# Patient Record
Sex: Male | Born: 1985 | Race: Asian | Hispanic: No | Marital: Single
Health system: Southern US, Community
[De-identification: ages and names within clinical notes are randomized; demographics above are authoritative.]

---
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
## 2013-04-21 ENCOUNTER — Ambulatory Visit (HOSPITAL_COMMUNITY)
Admission: RE | Admit: 2013-04-21 | Discharge: 2013-04-21 | Disposition: A | Discharge status: Home / Self Care | Payer: Self-pay | Source: Ambulatory Visit | Attending: Internal Medicine | Admitting: Internal Medicine

## 2013-04-21 ENCOUNTER — Other Ambulatory Visit (HOSPITAL_COMMUNITY): Payer: Self-pay | Admitting: Internal Medicine

## 2013-04-21 DIAGNOSIS — N4400 Torsion of testis, unspecified: Secondary | ICD-10-CM

## 2013-04-21 DIAGNOSIS — N509 Disorder of male genital organs, unspecified: Secondary | ICD-10-CM | POA: Insufficient documentation

## 2016-11-24 ENCOUNTER — Ambulatory Visit (HOSPITAL_BASED_OUTPATIENT_CLINIC_OR_DEPARTMENT_OTHER)
Admission: RE | Admit: 2016-11-24 | Discharge: 2016-11-24 | Disposition: A | Discharge status: Home / Self Care | Payer: Self-pay | Source: Ambulatory Visit | Attending: Internal Medicine | Admitting: Internal Medicine

## 2016-11-24 ENCOUNTER — Other Ambulatory Visit (HOSPITAL_BASED_OUTPATIENT_CLINIC_OR_DEPARTMENT_OTHER): Payer: Self-pay | Admitting: Internal Medicine

## 2016-11-24 DIAGNOSIS — R1084 Generalized abdominal pain: Secondary | ICD-10-CM

## 2016-11-24 DIAGNOSIS — R1011 Right upper quadrant pain: Secondary | ICD-10-CM | POA: Insufficient documentation

## 2017-11-28 ENCOUNTER — Emergency Department (HOSPITAL_COMMUNITY): Payer: Self-pay

## 2017-11-28 ENCOUNTER — Observation Stay (HOSPITAL_COMMUNITY)
Admission: EM | Admit: 2017-11-28 | Discharge: 2017-11-29 | Disposition: A | Discharge status: Home / Self Care | Payer: Self-pay | Attending: General Surgery | Admitting: General Surgery

## 2017-11-28 ENCOUNTER — Encounter (HOSPITAL_COMMUNITY)
Admission: EM | Disposition: A | Discharge status: Home / Self Care | Payer: Self-pay | Source: Home / Self Care | Attending: Emergency Medicine

## 2017-11-28 ENCOUNTER — Encounter (HOSPITAL_COMMUNITY): Payer: Self-pay | Admitting: *Deleted

## 2017-11-28 ENCOUNTER — Emergency Department (HOSPITAL_COMMUNITY): Payer: Self-pay | Admitting: Certified Registered"

## 2017-11-28 DIAGNOSIS — Z9049 Acquired absence of other specified parts of digestive tract: Secondary | ICD-10-CM

## 2017-11-28 DIAGNOSIS — K3580 Unspecified acute appendicitis: Principal | ICD-10-CM | POA: Insufficient documentation

## 2017-11-28 HISTORY — PX: LAPAROSCOPIC APPENDECTOMY: SHX408

## 2017-11-28 LAB — URINALYSIS, ROUTINE W REFLEX MICROSCOPIC
Bilirubin Urine: NEGATIVE
GLUCOSE, UA: NEGATIVE mg/dL
HGB URINE DIPSTICK: NEGATIVE
KETONES UR: NEGATIVE mg/dL
LEUKOCYTES UA: NEGATIVE
Nitrite: NEGATIVE
PROTEIN: NEGATIVE mg/dL
Specific Gravity, Urine: 1.014 (ref 1.005–1.030)
pH: 7 (ref 5.0–8.0)

## 2017-11-28 LAB — COMPREHENSIVE METABOLIC PANEL
ALT: 19 U/L (ref 17–63)
AST: 19 U/L (ref 15–41)
Albumin: 4.1 g/dL (ref 3.5–5.0)
Alkaline Phosphatase: 60 U/L (ref 38–126)
Anion gap: 7 (ref 5–15)
BUN: 16 mg/dL (ref 6–20)
CHLORIDE: 106 mmol/L (ref 101–111)
CO2: 26 mmol/L (ref 22–32)
CREATININE: 0.99 mg/dL (ref 0.61–1.24)
Calcium: 9 mg/dL (ref 8.9–10.3)
GFR calc Af Amer: >60 mL/min (ref >60)
GFR calc non Af Amer: >60 mL/min (ref >60)
Glucose, Bld: 109 mg/dL — ABNORMAL HIGH (ref 65–99)
Potassium: 4.2 mmol/L (ref 3.5–5.1)
SODIUM: 139 mmol/L (ref 135–145)
Total Bilirubin: 0.6 mg/dL (ref 0.3–1.2)
Total Protein: 6.8 g/dL (ref 6.5–8.1)

## 2017-11-28 LAB — CBC
HCT: 47.1 % (ref 39.0–52.0)
Hemoglobin: 15.6 g/dL (ref 13.0–17.0)
MCH: 29.5 pg (ref 26.0–34.0)
MCHC: 33.1 g/dL (ref 30.0–36.0)
MCV: 89 fL (ref 78.0–100.0)
PLATELETS: 236 10*3/uL (ref 150–400)
RBC: 5.29 MIL/uL (ref 4.22–5.81)
RDW: 11.9 % (ref 11.5–15.5)
WBC: 10.4 10*3/uL (ref 4.0–10.5)

## 2017-11-28 LAB — LIPASE, BLOOD: LIPASE: 24 U/L (ref 11–51)

## 2017-11-28 SURGERY — APPENDECTOMY, LAPAROSCOPIC
Anesthesia: General | Site: Abdomen

## 2017-11-28 MED ORDER — PHENYLEPHRINE HCL 10 MG/ML IJ SOLN
INTRAMUSCULAR | Status: DC | PRN
  Administered 2017-11-28: 120 ug via INTRAVENOUS
  Administered 2017-11-28 (×2): 80 ug via INTRAVENOUS

## 2017-11-28 MED ORDER — FENTANYL CITRATE (PF) 100 MCG/2ML IJ SOLN
INTRAMUSCULAR | Status: DC | PRN
  Administered 2017-11-28: 150 ug via INTRAVENOUS
  Administered 2017-11-28 (×2): 50 ug via INTRAVENOUS

## 2017-11-28 MED ORDER — ONDANSETRON HCL 4 MG/2ML IJ SOLN
4.0000 mg | Freq: Four times a day (QID) | INTRAMUSCULAR | Status: DC | PRN
Start: 2017-11-28 — End: 2017-11-29

## 2017-11-28 MED ORDER — OXYCODONE HCL 5 MG/5ML PO SOLN: 5.0000 mg | Freq: Once | ORAL | Status: DC | PRN

## 2017-11-28 MED ORDER — ACETAMINOPHEN 160 MG/5ML PO SOLN: 325.0000 mg | ORAL | Status: DC | PRN

## 2017-11-28 MED ORDER — BUPIVACAINE-EPINEPHRINE (PF) 0.25% -1:200000 IJ SOLN
INTRAMUSCULAR | Status: AC
  Filled 2017-11-28: qty 30

## 2017-11-28 MED ORDER — ROCURONIUM BROMIDE 10 MG/ML (PF) SYRINGE
PREFILLED_SYRINGE | INTRAVENOUS | Status: AC
  Filled 2017-11-28: qty 5

## 2017-11-28 MED ORDER — MIDAZOLAM HCL 5 MG/5ML IJ SOLN
INTRAMUSCULAR | Status: DC | PRN
  Administered 2017-11-28: 2 mg via INTRAVENOUS

## 2017-11-28 MED ORDER — ACETAMINOPHEN 650 MG RE SUPP: 650.0000 mg | Freq: Four times a day (QID) | RECTAL | Status: DC | PRN

## 2017-11-28 MED ORDER — PHENOL 1.4 % MT LIQD
1.0000 | OROMUCOSAL | Status: DC | PRN
  Administered 2017-11-28: 1 via OROMUCOSAL
  Filled 2017-11-28: qty 177

## 2017-11-28 MED ORDER — DEXAMETHASONE SODIUM PHOSPHATE 10 MG/ML IJ SOLN
INTRAMUSCULAR | Status: DC | PRN
  Administered 2017-11-28: 10 mg via INTRAVENOUS

## 2017-11-28 MED ORDER — IOHEXOL 300 MG/ML  SOLN: 100.0000 mL | Freq: Once | INTRAMUSCULAR | Status: DC | PRN

## 2017-11-28 MED ORDER — OXYCODONE HCL 5 MG PO TABS
5.0000 mg | ORAL_TABLET | ORAL | Status: DC | PRN
  Administered 2017-11-29 (×2): 10 mg via ORAL
  Filled 2017-11-28 (×2): qty 2

## 2017-11-28 MED ORDER — METHOCARBAMOL 500 MG PO TABS
500.0000 mg | ORAL_TABLET | Freq: Four times a day (QID) | ORAL | Status: DC | PRN
  Administered 2017-11-29: 500 mg via ORAL
  Filled 2017-11-28: qty 1

## 2017-11-28 MED ORDER — SODIUM CHLORIDE 0.9 % IV SOLN
1.0000 g | Freq: Once | INTRAVENOUS | Status: AC
  Administered 2017-11-28: 1 g via INTRAVENOUS
  Filled 2017-11-28: qty 10

## 2017-11-28 MED ORDER — DIPHENHYDRAMINE HCL 25 MG PO CAPS: 25.0000 mg | ORAL_CAPSULE | Freq: Four times a day (QID) | ORAL | Status: DC | PRN

## 2017-11-28 MED ORDER — DEXAMETHASONE SODIUM PHOSPHATE 10 MG/ML IJ SOLN
INTRAMUSCULAR | Status: AC
  Filled 2017-11-28: qty 1

## 2017-11-28 MED ORDER — KCL IN DEXTROSE-NACL 20-5-0.45 MEQ/L-%-% IV SOLN
INTRAVENOUS | Status: DC
  Administered 2017-11-28: 23:00:00 via INTRAVENOUS
  Filled 2017-11-28: qty 1000

## 2017-11-28 MED ORDER — MIDAZOLAM HCL 2 MG/2ML IJ SOLN
INTRAMUSCULAR | Status: AC
  Filled 2017-11-28: qty 2

## 2017-11-28 MED ORDER — ONDANSETRON HCL 4 MG/2ML IJ SOLN
INTRAMUSCULAR | Status: AC
  Filled 2017-11-28: qty 2

## 2017-11-28 MED ORDER — OXYCODONE HCL 5 MG PO TABS: 5.0000 mg | ORAL_TABLET | Freq: Once | ORAL | Status: DC | PRN

## 2017-11-28 MED ORDER — FENTANYL CITRATE (PF) 250 MCG/5ML IJ SOLN
INTRAMUSCULAR | Status: AC
  Filled 2017-11-28: qty 5

## 2017-11-28 MED ORDER — DIPHENHYDRAMINE HCL 50 MG/ML IJ SOLN: 25.0000 mg | Freq: Four times a day (QID) | INTRAMUSCULAR | Status: DC | PRN

## 2017-11-28 MED ORDER — BUPIVACAINE-EPINEPHRINE 0.25% -1:200000 IJ SOLN
INTRAMUSCULAR | Status: DC | PRN
  Administered 2017-11-28: 12 mL

## 2017-11-28 MED ORDER — ONDANSETRON HCL 4 MG/2ML IJ SOLN
INTRAMUSCULAR | Status: DC | PRN
  Administered 2017-11-28: 4 mg via INTRAVENOUS

## 2017-11-28 MED ORDER — PROPOFOL 10 MG/ML IV BOLUS
INTRAVENOUS | Status: DC | PRN
  Administered 2017-11-28: 140 mg via INTRAVENOUS

## 2017-11-28 MED ORDER — PROPOFOL 10 MG/ML IV BOLUS
INTRAVENOUS | Status: AC
  Filled 2017-11-28: qty 20

## 2017-11-28 MED ORDER — LIDOCAINE HCL (CARDIAC) PF 100 MG/5ML IV SOSY
PREFILLED_SYRINGE | INTRAVENOUS | Status: DC | PRN
  Administered 2017-11-28: 60 mg via INTRAVENOUS

## 2017-11-28 MED ORDER — SUGAMMADEX SODIUM 200 MG/2ML IV SOLN
INTRAVENOUS | Status: AC
  Filled 2017-11-28: qty 2

## 2017-11-28 MED ORDER — LACTATED RINGERS IV SOLN
INTRAVENOUS | Status: DC | PRN
  Administered 2017-11-28 (×2): via INTRAVENOUS

## 2017-11-28 MED ORDER — ACETAMINOPHEN 325 MG PO TABS
650.0000 mg | ORAL_TABLET | Freq: Four times a day (QID) | ORAL | Status: DC | PRN
  Administered 2017-11-29: 650 mg via ORAL
  Filled 2017-11-28: qty 2

## 2017-11-28 MED ORDER — MORPHINE SULFATE (PF) 4 MG/ML IV SOLN
4.0000 mg | Freq: Once | INTRAVENOUS | Status: AC
  Administered 2017-11-28: 4 mg via INTRAVENOUS
  Filled 2017-11-28: qty 1

## 2017-11-28 MED ORDER — METRONIDAZOLE IN NACL 5-0.79 MG/ML-% IV SOLN
500.0000 mg | Freq: Once | INTRAVENOUS | Status: AC
  Administered 2017-11-28: 500 mg via INTRAVENOUS
  Filled 2017-11-28: qty 100

## 2017-11-28 MED ORDER — IOHEXOL 300 MG/ML  SOLN
100.0000 mL | Freq: Once | INTRAMUSCULAR | Status: AC | PRN
  Administered 2017-11-28: 100 mL via INTRAVENOUS

## 2017-11-28 MED ORDER — ENOXAPARIN SODIUM 40 MG/0.4ML ~~LOC~~ SOLN
40.0000 mg | SUBCUTANEOUS | Status: DC
  Administered 2017-11-29: 40 mg via SUBCUTANEOUS
  Filled 2017-11-28: qty 0.4

## 2017-11-28 MED ORDER — SUGAMMADEX SODIUM 200 MG/2ML IV SOLN
INTRAVENOUS | Status: DC | PRN
  Administered 2017-11-28: 200 mg via INTRAVENOUS

## 2017-11-28 MED ORDER — ONDANSETRON 4 MG PO TBDP: 4.0000 mg | ORAL_TABLET | Freq: Four times a day (QID) | ORAL | Status: DC | PRN

## 2017-11-28 MED ORDER — MORPHINE SULFATE (PF) 4 MG/ML IV SOLN
4.0000 mg | INTRAVENOUS | Status: DC | PRN
  Administered 2017-11-29: 4 mg via INTRAVENOUS
  Filled 2017-11-28: qty 1

## 2017-11-28 MED ORDER — ROCURONIUM BROMIDE 100 MG/10ML IV SOLN
INTRAVENOUS | Status: DC | PRN
  Administered 2017-11-28: 10 mg via INTRAVENOUS
  Administered 2017-11-28: 50 mg via INTRAVENOUS

## 2017-11-28 MED ORDER — PHENYLEPHRINE 40 MCG/ML (10ML) SYRINGE FOR IV PUSH (FOR BLOOD PRESSURE SUPPORT)
PREFILLED_SYRINGE | INTRAVENOUS | Status: AC
  Filled 2017-11-28: qty 20

## 2017-11-28 MED ORDER — MENTHOL 3 MG MT LOZG
1.0000 | LOZENGE | OROMUCOSAL | Status: DC | PRN
  Administered 2017-11-28: 3 mg via ORAL
  Filled 2017-11-28: qty 9

## 2017-11-28 MED ORDER — SODIUM CHLORIDE 0.9 % IV BOLUS
1000.0000 mL | Freq: Once | INTRAVENOUS | Status: AC
  Administered 2017-11-28: 1000 mL via INTRAVENOUS

## 2017-11-28 MED ORDER — SUCCINYLCHOLINE CHLORIDE 200 MG/10ML IV SOSY
PREFILLED_SYRINGE | INTRAVENOUS | Status: AC
  Filled 2017-11-28: qty 10

## 2017-11-28 MED ORDER — ACETAMINOPHEN 325 MG PO TABS: 325.0000 mg | ORAL_TABLET | ORAL | Status: DC | PRN

## 2017-11-28 MED ORDER — FENTANYL CITRATE (PF) 100 MCG/2ML IJ SOLN: 25.0000 ug | INTRAMUSCULAR | Status: DC | PRN

## 2017-11-28 SURGICAL SUPPLY — 44 items
ADH SKN CLS APL DERMABOND .7 (GAUZE/BANDAGES/DRESSINGS) ×1
APPLIER CLIP ROT 10 11.4 M/L (STAPLE)
APR CLP MED LRG 11.4X10 (STAPLE)
BAG SPEC RTRVL 10 TROC 200 (ENDOMECHANICALS) ×1
BLADE CLIPPER SURG (BLADE) ×2 IMPLANT
CANISTER SUCT 3000ML PPV (MISCELLANEOUS) ×3 IMPLANT
CHLORAPREP W/TINT 26ML (MISCELLANEOUS) ×3 IMPLANT
CLIP APPLIE ROT 10 11.4 M/L (STAPLE) IMPLANT
COVER SURGICAL LIGHT HANDLE (MISCELLANEOUS) ×3 IMPLANT
CUTTER FLEX LINEAR 45M (STAPLE) ×3 IMPLANT
DERMABOND ADVANCED (GAUZE/BANDAGES/DRESSINGS) ×2
DERMABOND ADVANCED .7 DNX12 (GAUZE/BANDAGES/DRESSINGS) ×1 IMPLANT
ELECT REM PT RETURN 9FT ADLT (ELECTROSURGICAL) ×3
ELECTRODE REM PT RTRN 9FT ADLT (ELECTROSURGICAL) ×1 IMPLANT
GLOVE BIO SURGEON STRL SZ8 (GLOVE) ×5 IMPLANT
GLOVE BIOGEL PI IND STRL 8 (GLOVE) ×1 IMPLANT
GLOVE BIOGEL PI INDICATOR 8 (GLOVE) ×2
GOWN STRL REUS W/ TWL LRG LVL3 (GOWN DISPOSABLE) ×2 IMPLANT
GOWN STRL REUS W/ TWL XL LVL3 (GOWN DISPOSABLE) ×1 IMPLANT
GOWN STRL REUS W/TWL LRG LVL3 (GOWN DISPOSABLE) ×3
GOWN STRL REUS W/TWL XL LVL3 (GOWN DISPOSABLE) ×3
KIT BASIN OR (CUSTOM PROCEDURE TRAY) ×3 IMPLANT
KIT TURNOVER KIT B (KITS) ×3 IMPLANT
NEEDLE 22X1 1/2 (OR ONLY) (NEEDLE) ×3 IMPLANT
NS IRRIG 1000ML POUR BTL (IV SOLUTION) ×3 IMPLANT
PAD ARMBOARD 7.5X6 YLW CONV (MISCELLANEOUS) ×6 IMPLANT
POUCH RETRIEVAL ECOSAC 10 (ENDOMECHANICALS) ×1 IMPLANT
POUCH RETRIEVAL ECOSAC 10MM (ENDOMECHANICALS) ×2
RELOAD 45 VASCULAR/THIN (ENDOMECHANICALS) ×3 IMPLANT
RELOAD STAPLE 45 2.5 WHT GRN (ENDOMECHANICALS) IMPLANT
RELOAD STAPLE 45 3.5 BLU ETS (ENDOMECHANICALS) IMPLANT
RELOAD STAPLE TA45 3.5 REG BLU (ENDOMECHANICALS) IMPLANT
SCISSORS LAP 5X35 DISP (ENDOMECHANICALS) IMPLANT
SET IRRIG TUBING LAPAROSCOPIC (IRRIGATION / IRRIGATOR) ×3 IMPLANT
SHEARS HARMONIC ACE PLUS 36CM (ENDOMECHANICALS) ×3 IMPLANT
SPECIMEN JAR SMALL (MISCELLANEOUS) ×3 IMPLANT
SUT VIC AB 4-0 PS2 27 (SUTURE) ×3 IMPLANT
TOWEL OR 17X24 6PK STRL BLUE (TOWEL DISPOSABLE) ×3 IMPLANT
TOWEL OR 17X26 10 PK STRL BLUE (TOWEL DISPOSABLE) ×3 IMPLANT
TRAY LAPAROSCOPIC MC (CUSTOM PROCEDURE TRAY) ×3 IMPLANT
TROCAR XCEL 12X100 BLDLESS (ENDOMECHANICALS) ×3 IMPLANT
TROCAR XCEL BLUNT TIP 100MML (ENDOMECHANICALS) ×3 IMPLANT
TROCAR XCEL NON-BLD 5MMX100MML (ENDOMECHANICALS) ×3 IMPLANT
TUBING INSUFFLATION (TUBING) ×3 IMPLANT

## 2017-11-28 NOTE — Transfer of Care (Signed)
Immediate Anesthesia Transfer of Care Note  Patient: Zachary Combs  Procedure(s) Performed: APPENDECTOMY LAPAROSCOPIC (N/A Abdomen)  Patient Location: PACU  Anesthesia Type:General  Level of Consciousness: awake, alert  and oriented  Airway & Oxygen Therapy: Patient Spontanous Breathing  Post-op Assessment: Report given to RN and Post -op Vital signs reviewed and stable  Post vital signs: Reviewed and stable  Last Vitals:  Vitals Value Taken Time  BP 127/78 11/28/2017  9:39 PM  Temp    Pulse 89 11/28/2017  9:40 PM  Resp 23 11/28/2017  9:41 PM  SpO2 79 % 11/28/2017  9:40 PM  Vitals shown include unvalidated device data.  Last Pain:  Vitals:   11/28/17 1943  TempSrc:   PainSc: 6          Complications: No apparent anesthesia complications

## 2017-11-28 NOTE — H&P (Signed)
Zachary Combs is an 32 y.o. male.   Chief Complaint: RLQ pain HPI: C/O RLQ pain starting this AM. Had a similar episode last year that resolved. No anorexia. Labs unremarkable. CT shows appendicitis.  History reviewed. No pertinent past medical history.  History reviewed. No pertinent surgical history.  No family history on file. Social History:  has no tobacco, alcohol, and drug history on file.  Allergies: No Known Allergies   (Not in a hospital admission)  Results for orders placed or performed during the hospital encounter of 11/28/17 (from the past 48 hour(s))  Urinalysis, Routine w reflex microscopic     Status: Abnormal   Collection Time: 11/28/17 10:23 AM  Result Value Ref Range   Color, Urine STRAW (A) YELLOW   APPearance CLEAR CLEAR   Specific Gravity, Urine 1.014 1.005 - 1.030   pH 7.0 5.0 - 8.0   Glucose, UA NEGATIVE NEGATIVE mg/dL   Hgb urine dipstick NEGATIVE NEGATIVE   Bilirubin Urine NEGATIVE NEGATIVE   Ketones, ur NEGATIVE NEGATIVE mg/dL   Protein, ur NEGATIVE NEGATIVE mg/dL   Nitrite NEGATIVE NEGATIVE   Leukocytes, UA NEGATIVE NEGATIVE    Comment: Performed at [facility] [address] [place], [place] [address]  Lipase, blood     Status: None   Collection Time: 11/28/17 10:59 AM  Result Value Ref Range   Lipase 24 11 - 51 U/L    Comment: Performed at [facility] [address] [place], [place] [address]  Comprehensive metabolic panel     Status: Abnormal   Collection Time: 11/28/17 10:59 AM  Result Value Ref Range   Sodium 139 135 - 145 mmol/L   Potassium 4.2 3.5 - 5.1 mmol/L   Chloride 106 101 - 111 mmol/L   CO2 26 22 - 32 mmol/L   Glucose, Bld 109 (H) 65 - 99 mg/dL   BUN 16 6 - 20 mg/dL   Creatinine, Ser 0.99 0.61 - 1.24 mg/dL   Calcium 9.0 8.9 - 10.3 mg/dL   Total Protein 6.8 6.5 - 8.1 g/dL   Albumin 4.1 3.5 - 5.0 g/dL   AST 19 15 - 41 U/L   ALT 19 17 - 63 U/L   Alkaline Phosphatase 60 38 - 126 U/L   Total Bilirubin  0.6 0.3 - 1.2 mg/dL   GFR calc non Af Amer >60 >60 mL/min   GFR calc Af Amer >60 >60 mL/min    Comment: (NOTE) The eGFR has been calculated using the CKD EPI equation. This calculation has not been validated in all clinical situations. eGFR's persistently <60 mL/min signify possible Chronic Kidney Disease.    Anion gap 7 5 - 15    Comment: Performed at [facility] [address], [place] [address]  CBC     Status: None   Collection Time: 11/28/17 10:59 AM  Result Value Ref Range   WBC 10.4 4.0 - 10.5 K/uL   RBC 5.29 4.22 - 5.81 MIL/uL   Hemoglobin 15.6 13.0 - 17.0 g/dL   HCT 47.1 39.0 - 52.0 %   MCV 89.0 78.0 - 100.0 fL   MCH 29.5 26.0 - 34.0 pg   MCHC 33.1 30.0 - 36.0 g/dL   RDW 11.9 11.5 - 15.5 %   Platelets 236 150 - 400 K/uL    Comment: Performed at [facility] [address] [place], [place] [address]   Ct Abdomen Pelvis W Contrast  Result Date: 11/28/2017 CLINICAL DATA:  Right lower quadrant abdominal pain EXAM: CT ABDOMEN AND PELVIS WITH CONTRAST TECHNIQUE: Multidetector CT imaging of the abdomen and pelvis was performed using the standard protocol following bolus administration of intravenous contrast. CONTRAST:  192m OMNIPAQUE IOHEXOL 300 MG/ML  SOLN COMPARISON:  None. FINDINGS: Lower chest: Lung bases are clear. No effusions. Heart is normal size. Hepatobiliary: No focal hepatic abnormality. Gallbladder unremarkable. Pancreas: No focal abnormality or ductal dilatation. Spleen: No focal abnormality.  Normal size. Adrenals/Urinary Tract: No adrenal abnormality. No focal renal abnormality. No stones or hydronephrosis. Urinary bladder is unremarkable. Stomach/Bowel: Dilated appendix with inflammation and mucosal enhancement compatible with acute appendicitis. Stomach, large and small bowel grossly unremarkable. Vascular/Lymphatic: No evidence of aneurysm or adenopathy. Reproductive: No visible focal abnormality. Other: No free fluid or free air.  Musculoskeletal: No acute bony abnormality. IMPRESSION: Dilated, inflamed appendix with mucosal enhancement compatible with acute appendicitis. Location is retrocecal. Electronically Signed   By: KRolm BaptiseM.D.   On: 11/28/2017 18:49    Review of Systems  Constitutional: Negative for chills and fever.  HENT: Negative.   Eyes: Negative.   Respiratory: Negative.   Cardiovascular: Negative.   Gastrointestinal: Positive for abdominal pain. Negative for diarrhea, nausea and vomiting.  Genitourinary: Negative.   Musculoskeletal: Negative.   Skin: Negative.   Neurological: Negative.   Endo/Heme/Allergies: Negative.   Psychiatric/Behavioral: Negative.     Blood pressure 130/68, pulse 68, temperature 99.2 F (37.3 C), temperature source Oral, resp. rate 20, height _0  (1.626 m), weight 85.7 kg (189 lb), SpO2 98 %. Physical Exam  Constitutional: He is oriented to person, place, and time. He appears well-developed and well-nourished. No distress.  HENT:  Head: Normocephalic.  Right Ear: External ear normal.  Left Ear: External ear normal.  Nose: Nose normal.  Mouth/Throat: Oropharynx is clear and moist.  Eyes: Pupils are equal, round, and reactive to light. EOM are normal.  Neck: Neck supple. No thyromegaly present.  Cardiovascular: Normal rate and normal heart sounds.  Respiratory: Effort normal. No respiratory distress. He has no wheezes.  GI: Soft. He exhibits no distension. There is tenderness. There is no rebound and no guarding.  Mild tenderness RLQ  Musculoskeletal: Normal range of motion.  Neurological: He is alert and oriented to person, place, and time. He exhibits normal muscle tone.  Psychiatric: He has a normal mood and affect.     Assessment/Plan Acute appendicitis - to OR for lap appendectomy. IV Rocephin and Flagyl. I discussed the procedure, risks, and benefits. He agrees.  BZenovia Jarred MD 11/28/2017, 7:33 PM

## 2017-11-28 NOTE — ED Triage Notes (Signed)
To ED for eval of RLQ abd pain since waking this am. States he felt this pain a year ago but subsided after he changed his diet. Describes pain as sharp. Denies fevers. No vomiting. No urinary symptoms.

## 2017-11-28 NOTE — Anesthesia Preprocedure Evaluation (Signed)
Anesthesia Evaluation  Patient identified by MRN, date of birth, ID band Patient awake    Reviewed: Allergy & Precautions, NPO status , Patient's Chart, lab work & pertinent test results  History of Anesthesia Complications Negative for: history of anesthetic complications  Airway Mallampati: II  TM Distance: >3 FB Neck ROM: Full    Dental  (+) Teeth Intact   Pulmonary neg pulmonary ROS,    breath sounds clear to auscultation       Cardiovascular negative cardio ROS   Rhythm:Regular     Neuro/Psych negative neurological ROS  negative psych ROS   GI/Hepatic Neg liver ROS, appendicitis   Endo/Other  negative endocrine ROS  Renal/GU negative Renal ROS     Musculoskeletal   Abdominal   Peds  Hematology negative hematology ROS (+)   Anesthesia Other Findings   Reproductive/Obstetrics                             Anesthesia Physical Anesthesia Plan  ASA: I  Anesthesia Plan: General   Post-op Pain Management:    Induction: Intravenous  PONV Risk Score and Plan: 2 and Ondansetron and Dexamethasone  Airway Management Planned: Oral ETT  Additional Equipment: None  Intra-op Plan:   Post-operative Plan: Extubation in OR  Informed Consent: I have reviewed the patients History and Physical, chart, labs and discussed the procedure including the risks, benefits and alternatives for the proposed anesthesia with the patient or authorized representative who has indicated his/her understanding and acceptance.   Dental advisory given  Plan Discussed with: CRNA and Surgeon  Anesthesia Plan Comments:         Anesthesia Quick Evaluation

## 2017-11-28 NOTE — ED Notes (Signed)
Results reviewed.  No changes in acuity at this time 

## 2017-11-28 NOTE — ED Provider Notes (Signed)
[facility] EMERGENCY DEPARTMENT Provider Note   CSN: [id number] Arrival date & time: 11/28/17  1478     History   Chief Complaint Chief Complaint  Patient presents with  . Abdominal Pain    HPI Zachary Combs is a 32 y.o. male.  He has no significant past medical history.  States he woke up with right-sided abdominal pain that is moderate intensity sharp stabbing increased with cough and deep breath and twisting and palpation.  It is not associate with any fevers chills nausea vomiting diarrhea or urinary symptoms numbness or weakness.  He states about a year ago he had similar pain and went and had an ultrasound and had no diagnosis.  No change in activities.  Denies any trauma although he said he works at [employer] but does not feel its muscular.  The history is provided by the patient.  Abdominal Pain   This is a new problem. The current episode started 6 to 12 hours ago. The problem occurs constantly. The problem has not changed since onset.The pain is associated with an unknown factor. The pain is located in the RLQ and RUQ. The quality of the pain is sharp and shooting. The pain is moderate. Pertinent negatives include anorexia, fever, belching, diarrhea, flatus, hematochezia, melena, nausea, vomiting, constipation, dysuria, frequency, hematuria and headaches. The symptoms are aggravated by coughing, deep breathing and activity. Nothing relieves the symptoms. Past workup includes ultrasound.    History reviewed. No pertinent past medical history.  Patient Active Problem List   Diagnosis Date Noted  . S/P laparoscopic appendectomy 11/28/2017  late entry   History reviewed. No pertinent surgical history.      Home Medications    Prior to Admission medications   Medication Sig Start Date End Date Taking? Authorizing Provider  Menthol, Topical Analgesic, (BIOFREEZE ROLL-ON EX) Apply 1 application topically daily as needed (knee pain).   Yes [provider]  Omega-3 Fatty Acids (FISH OIL) 1000 MG CAPS Take 1,000 mg by mouth daily.   Yes [provider]  saccharomyces boulardii (FLORASTOR) 250 MG capsule Take 250 mg by mouth daily.   Yes [provider]  oxyCODONE (OXY IR/ROXICODONE) 5 MG immediate release tablet Take 1-2 tablets (5-10 mg total) by mouth every 6 (six) hours as needed for moderate pain or severe pain. 11/29/17   Abigail Miyamoto, MD    Family History No family history on file.  Social History Social History   Tobacco Use  . Smoking status: Not on file  Substance Use Topics  . Alcohol use: Not on file  . Drug use: Not on file  denies drugs   Allergies   Patient has no known allergies.   Review of Systems Review of Systems  Constitutional: Negative for fever.  HENT: Negative for sore throat.   Eyes: Negative for visual disturbance.  Respiratory: Negative for shortness of breath.   Cardiovascular: Negative for chest pain.  Gastrointestinal: Positive for abdominal pain. Negative for anorexia, constipation, diarrhea, flatus, hematochezia, melena, nausea and vomiting.  Genitourinary: Negative for dysuria, frequency and hematuria.  Musculoskeletal: Negative for back pain and neck pain.  Skin: Negative for rash.  Neurological: Negative for headaches.     Physical Exam Updated Vital Signs BP 130/68 (BP Location: Right Arm)   Pulse 68   Temp 99.2 F (37.3 C) (Oral)   Resp 20   Ht  (1.626 m)   Wt 85.7 kg (189 lb)   SpO2 98%   BMI  32.44 kg/m   Physical Exam  Constitutional: He appears well-developed and well-nourished.  HENT:  Head: Normocephalic and atraumatic.  Eyes: Conjunctivae are normal.  Neck: Neck supple.  Cardiovascular: Normal rate and regular rhythm.  No murmur heard. Pulmonary/Chest: Effort normal and breath sounds normal. No respiratory distress.  Abdominal: Soft. He exhibits no mass. There is tenderness in the right upper quadrant, right lower quadrant,  epigastric area and periumbilical area. There is no rigidity, no rebound, no guarding and no CVA tenderness.  Musculoskeletal: Normal range of motion. He exhibits no edema, tenderness or deformity.  Neurological: He is alert. He has normal strength. Gait normal. GCS eye subscore is 4. GCS verbal subscore is 5. GCS motor subscore is 6.  Skin: Skin is warm and dry. Capillary refill takes less than 2 seconds.  Psychiatric: He has a normal mood and affect.  Nursing note and vitals reviewed.    ED Treatments / Results  Labs (all labs ordered are listed, but only abnormal results are displayed) Labs Reviewed  COMPREHENSIVE METABOLIC PANEL - Abnormal; Notable for the following components:      Result Value   Glucose, Bld 109 (*)    All other components within normal limits  URINALYSIS, ROUTINE W REFLEX MICROSCOPIC - Abnormal; Notable for the following components:   Color, Urine STRAW (*)    All other components within normal limits  LIPASE, BLOOD  CBC    EKG None  Radiology Ct Abdomen Pelvis W Contrast  Result Date: 11/28/2017 CLINICAL DATA:  Right lower quadrant abdominal pain EXAM: CT ABDOMEN AND PELVIS WITH CONTRAST TECHNIQUE: Multidetector CT imaging of the abdomen and pelvis was performed using the standard protocol following bolus administration of intravenous contrast. CONTRAST:  OMNIPAQUE IOHEXOL 300 MG/ML  SOLN COMPARISON:  None. FINDINGS: Lower chest: Lung bases are clear. No effusions. Heart is normal size. Hepatobiliary: No focal hepatic abnormality. Gallbladder unremarkable. Pancreas: No focal abnormality or ductal dilatation. Spleen: No focal abnormality.  Normal size. Adrenals/Urinary Tract: No adrenal abnormality. No focal renal abnormality. No stones or hydronephrosis. Urinary bladder is unremarkable. Stomach/Bowel: Dilated appendix with inflammation and mucosal enhancement compatible with acute appendicitis. Stomach, large and small bowel grossly unremarkable.  Vascular/Lymphatic: No evidence of aneurysm or adenopathy. Reproductive: No visible focal abnormality. Other: No free fluid or free air. Musculoskeletal: No acute bony abnormality. IMPRESSION: Dilated, inflamed appendix with mucosal enhancement compatible with acute appendicitis. Location is retrocecal. Electronically Signed   By: Charlett Nose M.D.   On: 11/28/2017 18:49    Procedures Procedures (including critical care time)  Medications Ordered in ED Medications - No data to display   Initial Impression / Assessment and Plan / ED Course  I have reviewed the triage vital signs and the nursing notes.  Pertinent labs & imaging results that were available during my care of the patient were reviewed by me and considered in my medical decision making (see chart for details).  Clinical Course as of Nov 30 1101  Sat Nov 28, 2017  1905 CT is positive for appendicitis.  General surgery has been paged.  IV fluids pain medicine ceftriaxone and Flagyl been ordered.   [MB]    Clinical Course User Index [MB] Terrilee Files, MD     Final Clinical Impressions(s) / ED Diagnoses   Final diagnoses:  Acute appendicitis, unspecified acute appendicitis type    ED Discharge Orders    None       Terrilee Files, MD 11/30/17 1104

## 2017-11-28 NOTE — Anesthesia Procedure Notes (Signed)
Procedure Name: Intubation Date/Time: 11/28/2017 8:25 PM Performed by: Babs Bertin, CRNA Pre-anesthesia Checklist: Patient identified, Emergency Drugs available, Suction available and Patient being monitored Patient Re-evaluated:Patient Re-evaluated prior to induction Oxygen Delivery Method: Circle System Utilized Preoxygenation: Pre-oxygenation with 100% oxygen Induction Type: IV induction Ventilation: Mask ventilation without difficulty Laryngoscope Size: Mac and 3 Grade View: Grade I Tube type: Oral Tube size: 7.5 mm Number of attempts: 1 Airway Equipment and Method: Stylet and Oral airway Placement Confirmation: ETT inserted through vocal cords under direct vision,  positive ETCO2 and breath sounds checked- equal and bilateral Secured at: 22 cm Tube secured with: Tape Dental Injury: Teeth and Oropharynx as per pre-operative assessment

## 2017-11-28 NOTE — Op Note (Signed)
11/28/2017  9:30 PM  PATIENT:  Zachary Combs  32 y.o. male  PRE-OPERATIVE DIAGNOSIS:  APPENDICITIS  POST-OPERATIVE DIAGNOSIS:  SUPPURATIVE APPENDICITIS  PROCEDURE:  Procedure(s): APPENDECTOMY LAPAROSCOPIC  SURGEON:  Surgeon(s): Violeta Gelinas, MD  ASSISTANTS: none   ANESTHESIA:   local and general  EBL:  Total I/O In: 1000 [I.V.:1000] Out: -   BLOOD ADMINISTERED:none  DRAINS: none   SPECIMEN:  Excision  DISPOSITION OF SPECIMEN:  PATHOLOGY  COUNTS:  YES  DICTATION: .Dragon Dictation Findings: Acute suppurative appendicitis  Procedure in detail: Zachary Combs presents for appendectomy.  He received intravenous antibiotics.  Informed consent was obtained.  He was brought to the operating room and general endotracheal anesthesia was ministered by the anesthesia staff.  His abdomen was prepped and draped in a sterile fashion.  A timeout procedure was performed.The infraumbilical region was infiltrated with local. Infraumbilical incision was made. Subcutaneous tissues were dissected down revealing the anterior fascia. This was divided sharply along the midline. Peritoneal cavity was entered under direct vision without complication. A 0 Vicryl pursestring was placed around the fascial opening. Hassan trocar was inserted into the abdomen. The abdomen was insufflated with carbon dioxide in standard fashion. Under direct vision a 12 mm left lower quadrant and a 5 mm right mid abdomen port were placed.  Local was used at each port site.  Laparoscopic expiration revealed an inflamed appendix that was mostly retrocecal.  The base was circumferentially dissected and the proximal portion of the mesoappendix was taken down with the harmonic scalpel.  The base of the appendix was divided with Endo GIA with a vascular load.  The remainder the mesoappendix was taken with harmonic scalpel and the rest of the appendix was gently teased out of the retroperitoneum.  It was placed in the bag and removed  from the abdomen.  It was sent to pathology.  The abdomen was copiously irrigated.  Hemostasis was obtained.  The staple line on the cecum was intact without bleeding.  The area was further irrigated and irrigation fluid was evacuated.  The ports were removed under direct vision.  Pneumoperitoneum was released.  The infra umbilical fascia was closed by tying the pursestring.  All 3 wounds were irrigated and the skin which was closed with 4-0 Vicryl followed by Dermabond.  All counts were correct.  He tolerated the procedure well without apparent complication and was taken recovery room in stable condition.  PATIENT DISPOSITION:  PACU - hemodynamically stable.   Delay start of Pharmacological VTE agent (>24hrs) due to surgical blood loss or risk of bleeding:  no  Violeta Gelinas, MD, MPH, FACS Pager: [phone]  5/18/20199:30 PM

## 2017-11-29 MED ORDER — OXYCODONE HCL 5 MG PO TABS: 5.0000 mg | ORAL_TABLET | Freq: Four times a day (QID) | ORAL | 0 refills | Status: AC | PRN

## 2017-11-29 NOTE — Progress Notes (Signed)
Pt discharged home in stable condition after going over discharge teaching with no concerns voiced. AVS and discharge script given before leaving unit 

## 2017-11-29 NOTE — Discharge Summary (Signed)
Physician Discharge Summary  Patient ID: Zachary Combs MRN: [id number] DOB/AGE: 1986-03-28 32 y.o.  Admit date: 11/28/2017 Discharge date: 11/29/2017  Admission Diagnoses:  Discharge Diagnoses:  Active Problems:   S/P laparoscopic appendectomy   Discharged Condition: good  Hospital Course: Uneventful post op recovery s/p surgery.  Discharged home pod #1  Consults: None  Significant Diagnostic Studies:   Treatments: surgery: laparoscopic appendectomy  Discharge Exam: Blood pressure 99/60, pulse (!) 57, temperature 97.8 F (36.6 C), temperature source Oral, resp. rate 17, height  (1.626 m), weight 85.7 kg (189 lb), SpO2 98 %. General appearance: alert, cooperative and no distress Resp: clear to auscultation bilaterally Incision/Wound:abdomen soft, incisions clean  Disposition: Discharge disposition: 01-Home or Self Care        Allergies as of 11/29/2017   No Known Allergies     Medication List    TAKE these medications   BIOFREEZE ROLL-ON EX Apply 1 application topically daily as needed (knee pain).   Fish Oil 1000 MG Caps Take 1,000 mg by mouth daily.   oxyCODONE 5 MG immediate release tablet Commonly known as:  Oxy IR/ROXICODONE Take 1-2 tablets (5-10 mg total) by mouth every 6 (six) hours as needed for moderate pain or severe pain.   saccharomyces boulardii 250 MG capsule Commonly known as:  FLORASTOR Take 250 mg by mouth daily.      Follow-up Information    Violeta Gelinas, MD. Schedule an appointment as soon as possible for a visit in 3 week(s).   Specialty:  General Surgery Contact information: [address] [place] [address] [phone]           Signed: Shelly Rubenstein 11/29/2017, 10:01 AM

## 2017-11-29 NOTE — Discharge Instructions (Signed)
[facility] ______[facility] LAPAROSCOPIC SURGERY: POST OP INSTRUCTIONS Always review your discharge instruction sheet given to you by the facility where your surgery was performed. IF YOU HAVE DISABILITY OR FAMILY LEAVE FORMS, YOU MUST BRING THEM TO THE OFFICE FOR PROCESSING.   DO NOT GIVE THEM TO YOUR DOCTOR.  1. A prescription for pain medication may be given to you upon discharge.  Take your pain medication as prescribed, if needed.  If narcotic pain medicine is not needed, then you may take acetaminophen (Tylenol) or ibuprofen (Advil) as needed. 2. Take your usually prescribed medications unless otherwise directed. 3. If you need a refill on your pain medication, please contact your pharmacy.  They will contact our office to request authorization. Prescriptions will not be filled after 5pm or on week-ends. 4. You should follow a light diet the first few days after arrival home, such as soup and crackers, etc.  Be sure to include lots of fluids daily. 5. Most patients will experience some swelling and bruising in the area of the incisions.  Ice packs will help.  Swelling and bruising can take several days to resolve.  6. It is common to experience some constipation if taking pain medication after surgery.  Increasing fluid intake and taking a stool softener (such as Colace) will usually help or prevent this problem from occurring.  A mild laxative (Milk of Magnesia or Miralax) should be taken according to package instructions if there are no bowel movements after 48 hours. 7. Unless discharge instructions indicate otherwise, you may remove your bandages 24-48 hours after surgery, and you may shower at that time.  You may have steri-strips (small skin tapes) in place directly over the incision.  These strips should be left on the skin for 7-10 days.  If your surgeon used skin glue on the incision, you may shower in 24 hours.  The glue will flake off over the next 2-3 weeks.  Any sutures or  staples will be removed at the office during your follow-up visit. 8. ACTIVITIES:  You may resume regular (light) daily activities beginning the next day--such as daily self-care, walking, climbing stairs--gradually increasing activities as tolerated.  You may have sexual intercourse when it is comfortable.  Refrain from any heavy lifting or straining until approved by your doctor. a. You may drive when you are no longer taking prescription pain medication, you can comfortably wear a seatbelt, and you can safely maneuver your car and apply brakes. b. RETURN TO WORK:  ____1 - 2 WEEKS______________________________________________________ 9. You should see your doctor in the office for a follow-up appointment approximately 2-3 weeks after your surgery.  Make sure that you call for this appointment within a day or two after you arrive home to insure a convenient appointment time. 10. OTHER INSTRUCTIONS: OK TO SHOWER STARTING TODAY 11. ICE PACK, TYLENOL, IBUPROFEN FOR PAIN 12. NO LIFTING OVER 15 TO 20 POUNDS FOR 2 WEEKS 13. CALL OUR OFFICE FOR ANY NOTES FOR WORK __________________________________________________________________________________________________________________________ __________________________________________________________________________________________________________________________ WHEN TO CALL YOUR DOCTOR: 1. Fever over 101.0 2. Inability to urinate 3. Continued bleeding from incision. 4. Increased pain, redness, or drainage from the incision. 5. Increasing abdominal pain  The clinic staff is available to answer your questions during regular business hours.  Please dont hesitate to call and ask to speak to one of the nurses for clinical concerns.  If you have a medical emergency, go to the nearest emergency room or call 911.  A surgeon from [facility] is  always on call at the hospital. [address], [address], [place], [place]  [address] ? [address] [address],  [place], [place]   [address] [phone] ? [phone] ? FAX [phone] Web site: [email or web address]

## 2017-11-29 NOTE — Progress Notes (Signed)
1 Day Post-Op   Subjective/Chief Complaint: Doing well Tolerating po   Objective: Vital signs in last 24 hours: Temp:  [97.7 F (36.5 C)-100.3 F (37.9 C)] 97.8 F (36.6 C) (05/19 0616) Pulse Rate:  [57-89] 57 (05/19 0616) Resp:  [16-21] 17 (05/19 0616) BP: (99-135)/(54-80) 99/60 (05/19 0616) SpO2:  [90 %-100 %] 98 % (05/19 0616) Last BM Date: 11/28/17  Intake/Output from previous day: 05/18 0701 - 05/19 0700 In: 2007.5 [P.O.:120; I.V.:1887.5] Out: 320 [Urine:300; Blood:20] Intake/Output this shift: No intake/output data recorded.  Exam: Awake and alert Abdomen soft, non distended  Lab Results:  Recent Labs    11/28/17 1059  WBC 10.4  HGB 15.6  HCT 47.1  PLT 236   BMET Recent Labs    11/28/17 1059  NA 139  K 4.2  CL 106  CO2 26  GLUCOSE 109*  BUN 16  CREATININE 0.99  CALCIUM 9.0   PT/INR No results for input(s): LABPROT, INR in the last 72 hours. ABG No results for input(s): PHART, HCO3 in the last 72 hours.  Invalid input(s): PCO2, PO2  Studies/Results: Ct Abdomen Pelvis W Contrast  Result Date: 11/28/2017 CLINICAL DATA:  Right lower quadrant abdominal pain EXAM: CT ABDOMEN AND PELVIS WITH CONTRAST TECHNIQUE: Multidetector CT imaging of the abdomen and pelvis was performed using the standard protocol following bolus administration of intravenous contrast. CONTRAST:  OMNIPAQUE IOHEXOL 300 MG/ML  SOLN COMPARISON:  None. FINDINGS: Lower chest: Lung bases are clear. No effusions. Heart is normal size. Hepatobiliary: No focal hepatic abnormality. Gallbladder unremarkable. Pancreas: No focal abnormality or ductal dilatation. Spleen: No focal abnormality.  Normal size. Adrenals/Urinary Tract: No adrenal abnormality. No focal renal abnormality. No stones or hydronephrosis. Urinary bladder is unremarkable. Stomach/Bowel: Dilated appendix with inflammation and mucosal enhancement compatible with acute appendicitis. Stomach, large and small bowel grossly  unremarkable. Vascular/Lymphatic: No evidence of aneurysm or adenopathy. Reproductive: No visible focal abnormality. Other: No free fluid or free air. Musculoskeletal: No acute bony abnormality. IMPRESSION: Dilated, inflamed appendix with mucosal enhancement compatible with acute appendicitis. Location is retrocecal. Electronically Signed   By: Charlett Nose M.D.   On: 11/28/2017 18:49    Anti-infectives: Anti-infectives (From admission, onward)   Start     Dose/Rate Route Frequency Ordered Stop   11/28/17 1915  cefTRIAXone (ROCEPHIN) 1 g in sodium chloride 0.9 % 100 mL IVPB     1 g 200 mL/hr over 30 Minutes Intravenous  Once 11/28/17 1905 11/28/17 2014   11/28/17 1915  metroNIDAZOLE (FLAGYL) IVPB 500 mg     500 mg 100 mL/hr over 60 Minutes Intravenous  Once 11/28/17 1905 11/28/17 2030      Assessment/Plan: s/p Procedure(s): APPENDECTOMY LAPAROSCOPIC (N/A)  Doing well D/c home after lunch  LOS: 0 days    Isao Seltzer A 11/29/2017

## 2017-11-30 ENCOUNTER — Encounter (HOSPITAL_COMMUNITY): Payer: Self-pay | Admitting: Emergency Medicine

## 2017-12-01 LAB — HIV ANTIBODY (ROUTINE TESTING W REFLEX): HIV SCREEN 4TH GENERATION: NONREACTIVE

## 2017-12-08 NOTE — Anesthesia Postprocedure Evaluation (Signed)
Anesthesia Post Note  Patient: Zachary Combs  Procedure(s) Performed: APPENDECTOMY LAPAROSCOPIC (N/A Abdomen)     Patient location during evaluation: PACU Anesthesia Type: General Level of consciousness: awake and alert Pain management: pain level controlled Vital Signs Assessment: post-procedure vital signs reviewed and stable Respiratory status: spontaneous breathing, nonlabored ventilation, respiratory function stable and patient connected to nasal cannula oxygen Cardiovascular status: blood pressure returned to baseline and stable Postop Assessment: no apparent nausea or vomiting Anesthetic complications: no    Last Vitals:  Vitals:   11/29/17 1046 11/29/17 1345  BP: 113/71 (!) 119/48  Pulse: 61 (!) 59  Resp:    Temp: 36.6 C 36.6 C  SpO2: 97% 96%    Last Pain:  Vitals:   11/29/17 1345  TempSrc: Oral  PainSc:                  Darrnell Mangiaracina

## 2019-08-21 IMAGING — CT CT ABD-PELV W/ CM
2 of 4 series · 17 of 46 positions shown, 19 images · IV contrast (omnipaque)
Comparison: None.

CLINICAL DATA: Right lower quadrant abdominal pain

EXAM:
CT ABDOMEN AND PELVIS WITH CONTRAST
TECHNIQUE: Multidetector CT imaging of the abdomen and pelvis was performed
using the standard protocol following bolus administration of
intravenous contrast.
CONTRAST:  100mL OMNIPAQUE IOHEXOL 300 MG/ML  SOLN

[Series 3: abd/ pelvis 5.0 i30f 2 · axial · 0.97mm/px · z∈[+610,+1055]mm · 14 of 97 slices shown, 16 images]
[im 4/97  soft-tissue]
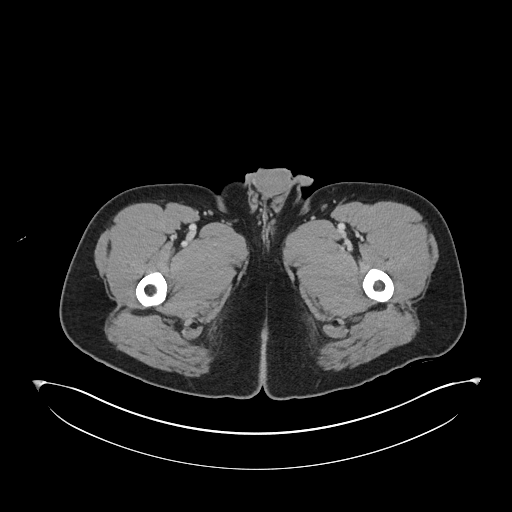
[im 4/97  bone]
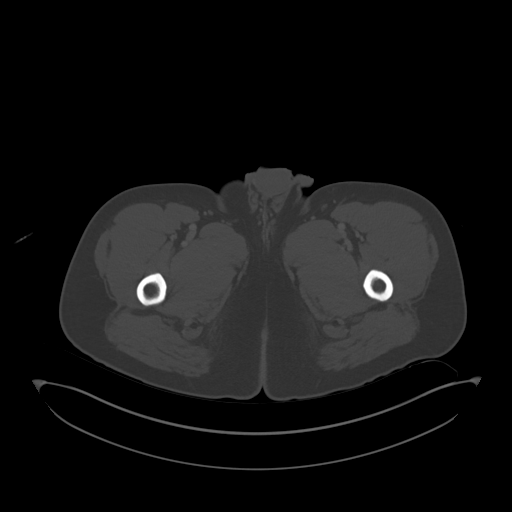
[im 12/97  soft-tissue]
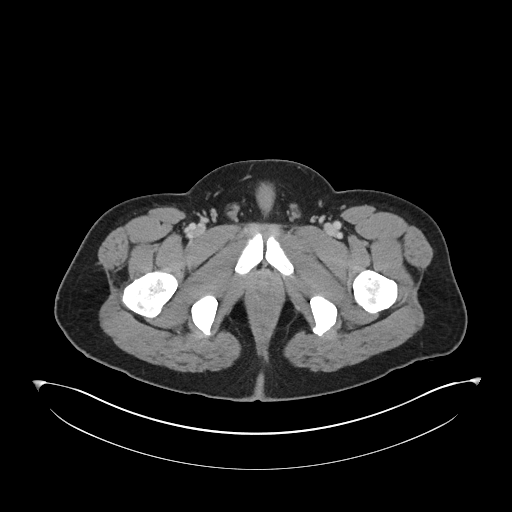
[im 20/97  soft-tissue]
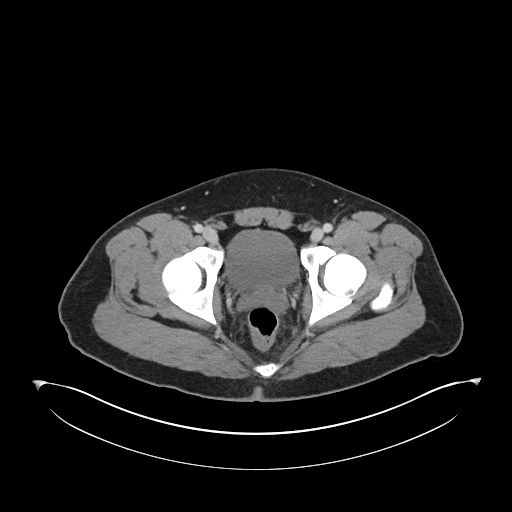
[im 27/97  soft-tissue]
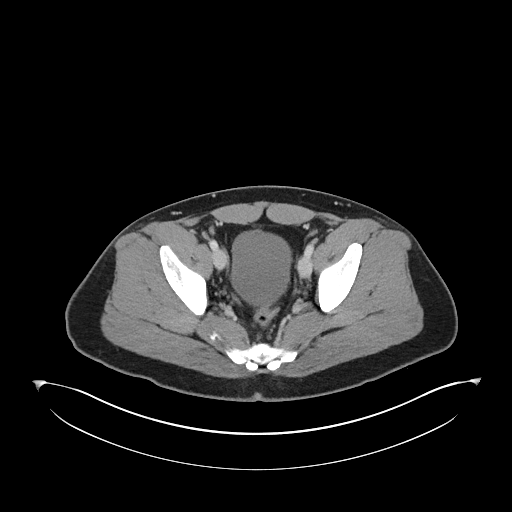
[im 31/97  soft-tissue]
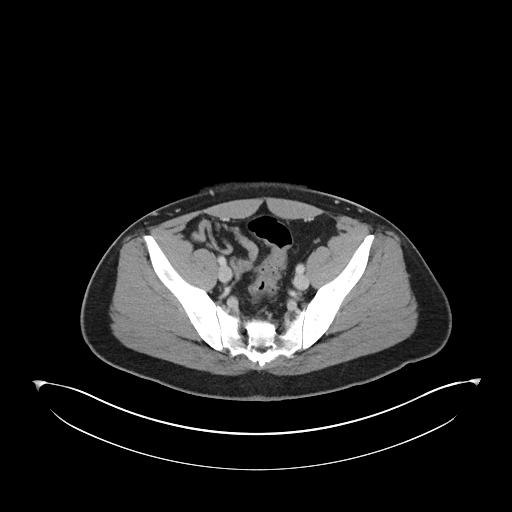
[im 39/97  soft-tissue]
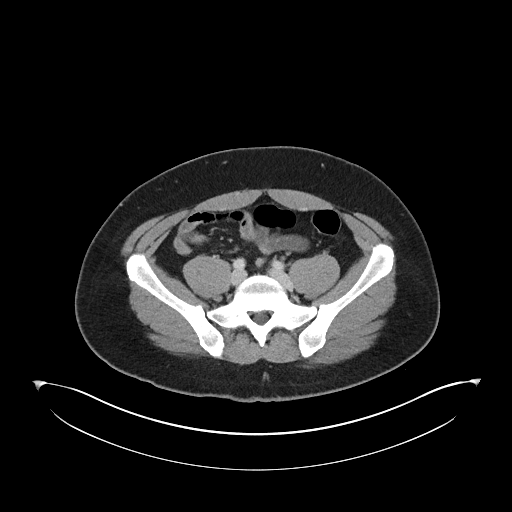
[im 47/97  soft-tissue]
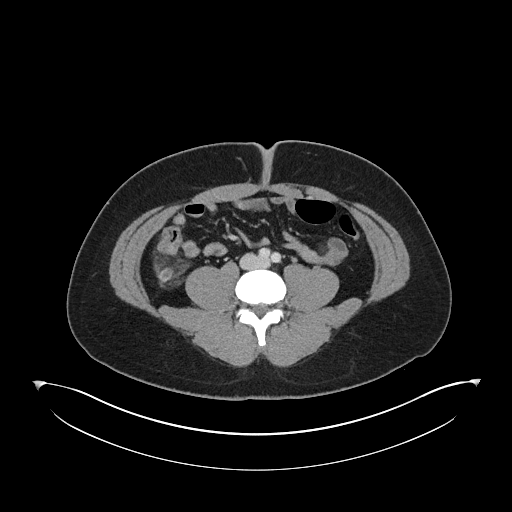
[im 50/97  soft-tissue]
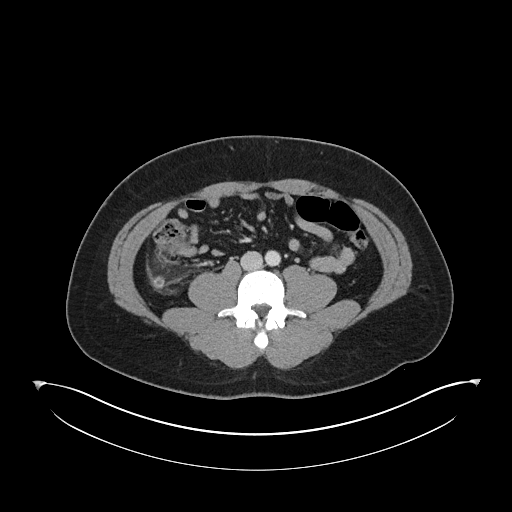
[im 58/97  soft-tissue]
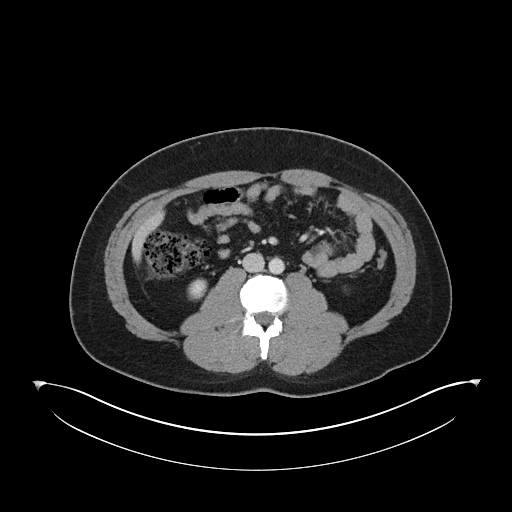
[im 58/97  bone]
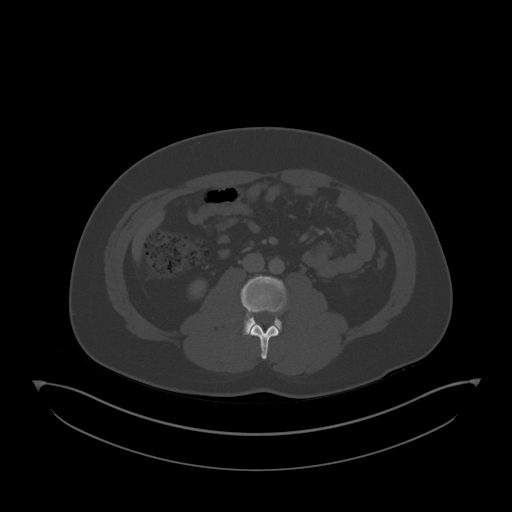
[im 66/97  soft-tissue]
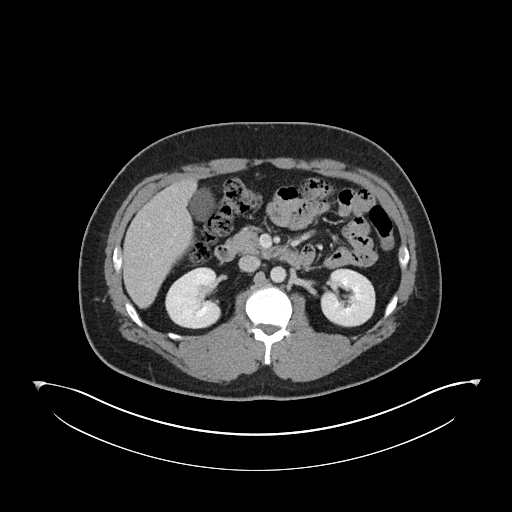
[im 73/97  soft-tissue]
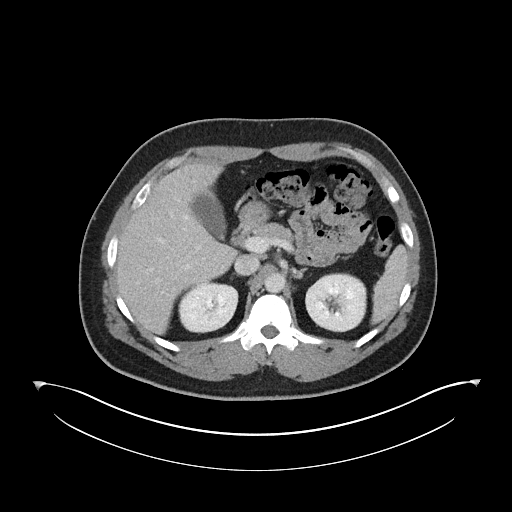
[im 77/97  soft-tissue]
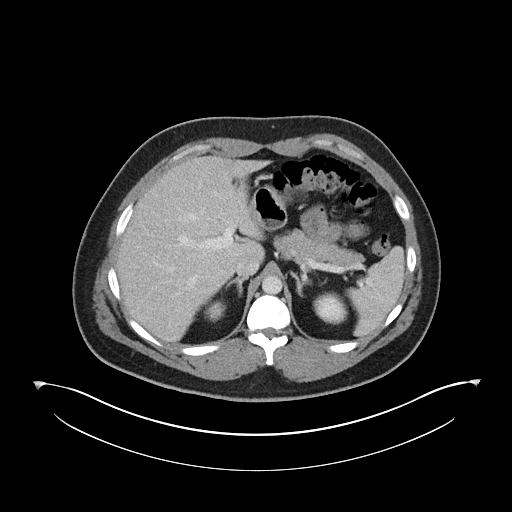
[im 85/97  soft-tissue]
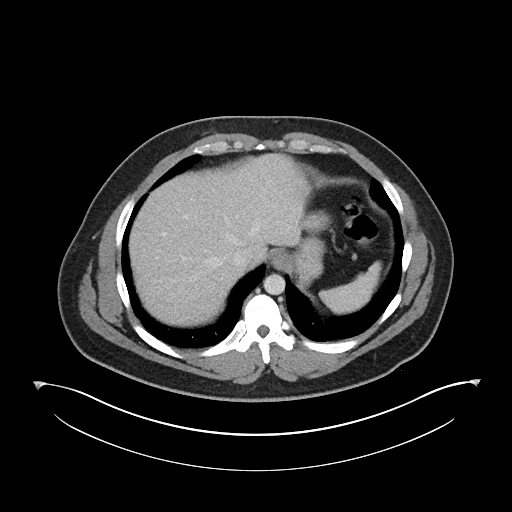
[im 93/97  soft-tissue]
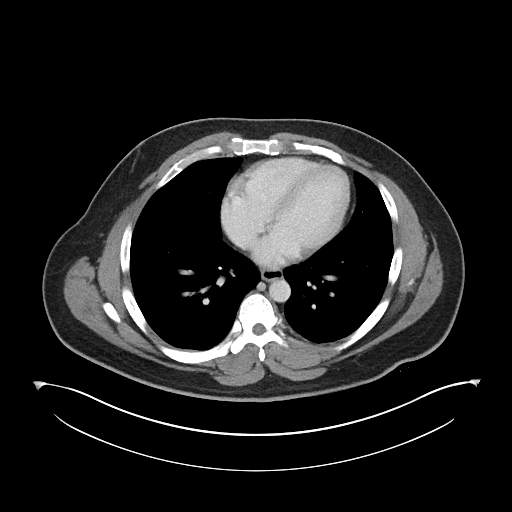

[Series 6: coronal soft tissue · coronal · 0.84mm/px · 3 of 104 slices shown]
[im 35/104  soft-tissue]
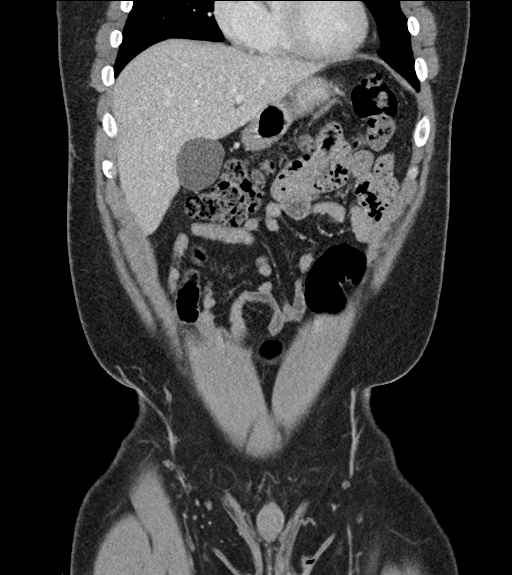
[im 46/104  soft-tissue]
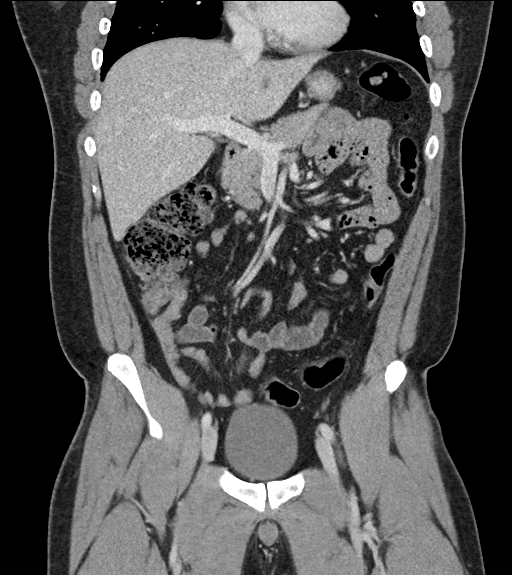
[im 58/104  soft-tissue]
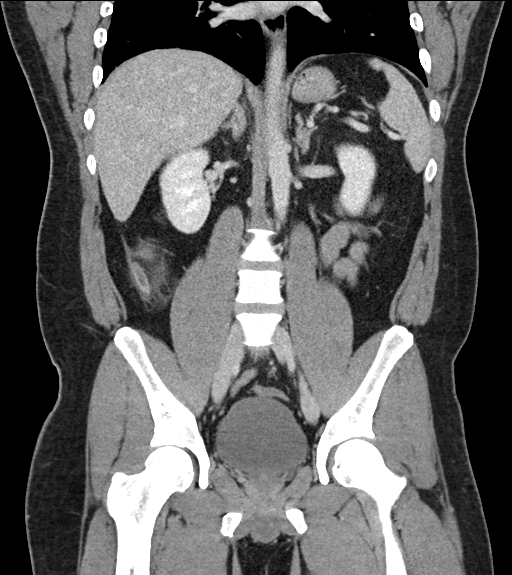

[17 of 46 positions shown; findings below may reference images not displayed]

FINDINGS: Lower chest: Lung bases are clear. No effusions. Heart is normal
size.

Hepatobiliary: No focal hepatic abnormality. Gallbladder
unremarkable.

Pancreas: No focal abnormality or ductal dilatation.

Spleen: No focal abnormality.  Normal size.

Adrenals/Urinary Tract: No adrenal abnormality. No focal renal
abnormality. No stones or hydronephrosis. Urinary bladder is
unremarkable.

Stomach/Bowel: Dilated appendix with inflammation and mucosal
enhancement compatible with acute appendicitis. Stomach, large and
small bowel grossly unremarkable.

Vascular/Lymphatic: No evidence of aneurysm or adenopathy.

Reproductive: No visible focal abnormality.

Other: No free fluid or free air.

Musculoskeletal: No acute bony abnormality.
IMPRESSION: Dilated, inflamed appendix with mucosal enhancement compatible with
acute appendicitis. Location is retrocecal.
